# Patient Record
Sex: Female | Born: 2005 | Race: White | Hispanic: No | Marital: Single | Smoking: Never smoker
Health system: Southern US, Community
[De-identification: ages and names within clinical notes are randomized; demographics above are authoritative.]

## PROBLEM LIST (undated history)

## (undated) DIAGNOSIS — E041 Nontoxic single thyroid nodule: Secondary | ICD-10-CM

## (undated) HISTORY — DX: Nontoxic single thyroid nodule: E04.1

---
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
## 2023-02-10 DIAGNOSIS — J019 Acute sinusitis, unspecified: Secondary | ICD-10-CM | POA: Diagnosis not present

## 2023-03-06 ENCOUNTER — Other Ambulatory Visit: Payer: Self-pay | Admitting: Family Medicine

## 2023-03-06 DIAGNOSIS — E041 Nontoxic single thyroid nodule: Secondary | ICD-10-CM

## 2023-04-27 DIAGNOSIS — M2391 Unspecified internal derangement of right knee: Secondary | ICD-10-CM | POA: Diagnosis not present

## 2023-04-27 DIAGNOSIS — M25561 Pain in right knee: Secondary | ICD-10-CM | POA: Diagnosis not present

## 2023-04-27 DIAGNOSIS — M899 Disorder of bone, unspecified: Secondary | ICD-10-CM | POA: Diagnosis not present

## 2023-04-30 DIAGNOSIS — M2391 Unspecified internal derangement of right knee: Secondary | ICD-10-CM | POA: Diagnosis not present

## 2023-05-05 DIAGNOSIS — M25861 Other specified joint disorders, right knee: Secondary | ICD-10-CM | POA: Diagnosis not present

## 2023-05-07 DIAGNOSIS — D480 Neoplasm of uncertain behavior of bone and articular cartilage: Secondary | ICD-10-CM | POA: Diagnosis not present

## 2023-05-11 DIAGNOSIS — M899 Disorder of bone, unspecified: Secondary | ICD-10-CM | POA: Diagnosis not present

## 2023-05-11 DIAGNOSIS — D480 Neoplasm of uncertain behavior of bone and articular cartilage: Secondary | ICD-10-CM | POA: Diagnosis not present

## 2023-05-15 DIAGNOSIS — D480 Neoplasm of uncertain behavior of bone and articular cartilage: Secondary | ICD-10-CM | POA: Diagnosis not present

## 2023-05-19 ENCOUNTER — Other Ambulatory Visit: Payer: BC Managed Care – PPO

## 2023-05-19 ENCOUNTER — Ambulatory Visit
Admission: RE | Admit: 2023-05-19 | Discharge: 2023-05-19 | Disposition: A | Discharge status: Home / Self Care | Payer: BC Managed Care – PPO | Source: Ambulatory Visit | Attending: Family Medicine | Admitting: Family Medicine

## 2023-05-19 DIAGNOSIS — E041 Nontoxic single thyroid nodule: Secondary | ICD-10-CM

## 2023-05-28 DIAGNOSIS — D480 Neoplasm of uncertain behavior of bone and articular cartilage: Secondary | ICD-10-CM | POA: Diagnosis not present

## 2023-06-09 DIAGNOSIS — D480 Neoplasm of uncertain behavior of bone and articular cartilage: Secondary | ICD-10-CM | POA: Diagnosis not present

## 2023-06-09 DIAGNOSIS — R2689 Other abnormalities of gait and mobility: Secondary | ICD-10-CM | POA: Diagnosis not present

## 2023-06-22 DIAGNOSIS — R2689 Other abnormalities of gait and mobility: Secondary | ICD-10-CM | POA: Diagnosis not present

## 2023-06-22 DIAGNOSIS — D480 Neoplasm of uncertain behavior of bone and articular cartilage: Secondary | ICD-10-CM | POA: Diagnosis not present

## 2023-06-26 DIAGNOSIS — R2689 Other abnormalities of gait and mobility: Secondary | ICD-10-CM | POA: Diagnosis not present

## 2023-06-26 DIAGNOSIS — D480 Neoplasm of uncertain behavior of bone and articular cartilage: Secondary | ICD-10-CM | POA: Diagnosis not present

## 2023-06-29 DIAGNOSIS — D480 Neoplasm of uncertain behavior of bone and articular cartilage: Secondary | ICD-10-CM | POA: Diagnosis not present

## 2023-06-29 DIAGNOSIS — R2689 Other abnormalities of gait and mobility: Secondary | ICD-10-CM | POA: Diagnosis not present

## 2023-07-16 DIAGNOSIS — D480 Neoplasm of uncertain behavior of bone and articular cartilage: Secondary | ICD-10-CM | POA: Diagnosis not present

## 2023-10-15 DIAGNOSIS — D480 Neoplasm of uncertain behavior of bone and articular cartilage: Secondary | ICD-10-CM | POA: Diagnosis not present

## 2023-12-14 DIAGNOSIS — Z309 Encounter for contraceptive management, unspecified: Secondary | ICD-10-CM | POA: Diagnosis not present

## 2024-03-17 DIAGNOSIS — Z6833 Body mass index (BMI) 33.0-33.9, adult: Secondary | ICD-10-CM | POA: Diagnosis not present

## 2024-03-17 DIAGNOSIS — B9789 Other viral agents as the cause of diseases classified elsewhere: Secondary | ICD-10-CM | POA: Diagnosis not present

## 2024-03-17 DIAGNOSIS — J988 Other specified respiratory disorders: Secondary | ICD-10-CM | POA: Diagnosis not present

## 2024-05-18 DIAGNOSIS — D480 Neoplasm of uncertain behavior of bone and articular cartilage: Secondary | ICD-10-CM | POA: Diagnosis not present
